# Patient Record
Sex: Male | Born: 1995 | Race: White | Hispanic: No | Marital: Single | Smoking: Never smoker
Health system: Southern US, Community
[De-identification: ages and names within clinical notes are randomized; demographics above are authoritative.]

---

## 2016-10-03 ENCOUNTER — Ambulatory Visit (HOSPITAL_COMMUNITY)
Admission: EM | Admit: 2016-10-03 | Discharge: 2016-10-03 | Disposition: A | Discharge status: Other Acute Inpatient Hospital | Payer: Self-pay

## 2016-10-03 ENCOUNTER — Emergency Department (HOSPITAL_COMMUNITY)
Admission: EM | Admit: 2016-10-03 | Discharge: 2016-10-03 | Disposition: A | Discharge status: Home / Self Care | Payer: BLUE CROSS/BLUE SHIELD | Attending: Emergency Medicine | Admitting: Emergency Medicine

## 2016-10-03 ENCOUNTER — Emergency Department (HOSPITAL_COMMUNITY): Payer: BLUE CROSS/BLUE SHIELD

## 2016-10-03 ENCOUNTER — Encounter (HOSPITAL_COMMUNITY): Payer: Self-pay | Admitting: *Deleted

## 2016-10-03 DIAGNOSIS — S61215A Laceration without foreign body of left ring finger without damage to nail, initial encounter: Secondary | ICD-10-CM | POA: Insufficient documentation

## 2016-10-03 DIAGNOSIS — Y929 Unspecified place or not applicable: Secondary | ICD-10-CM | POA: Insufficient documentation

## 2016-10-03 DIAGNOSIS — Y939 Activity, unspecified: Secondary | ICD-10-CM | POA: Diagnosis not present

## 2016-10-03 DIAGNOSIS — W260XXA Contact with knife, initial encounter: Secondary | ICD-10-CM | POA: Insufficient documentation

## 2016-10-03 DIAGNOSIS — Y999 Unspecified external cause status: Secondary | ICD-10-CM | POA: Diagnosis not present

## 2016-10-03 NOTE — ED Provider Notes (Signed)
[facility]-EMERGENCY DEPT Provider Note   CSN: [id number] Arrival date & time: 10/03/16  1540   By signing my name below, I, Teofilo Pod, attest that this documentation has been prepared under the direction and in the presence of Kerrie Buffalo, NP. Electronically Signed: Teofilo Pod, ED Scribe. 10/03/2016. 6:07 PM.   History   Chief Complaint Chief Complaint  Patient presents with  . Extremity Laceration    The history is provided by the patient. No language interpreter was used.  Laceration   The incident occurred 3 to 5 hours ago. The laceration is located on the left hand. The laceration mechanism was a a dirty knife. His tetanus status is UTD.   HPI Comments:  Justin Sawyer is a 21 y.o. male who presents to the Emergency Department complaining of a wound sustained to the left 4th and 5th finger that occurred 4 hours ago. Pt reports that he was cutting an onion and the knife slipped and cut his fingers. Pt complains of associated mild numbness. Tetanus UTD. Bleeding is controlled with pressure dressing. Pt denies other associated symptoms.   History reviewed. No pertinent past medical history.  There are no active problems to display for this patient.   History reviewed. No pertinent surgical history.     Home Medications    Prior to Admission medications   Not on File    Family History No family history on file.  Social History Social History  Substance Use Topics  . Smoking status: Never Smoker  . Smokeless tobacco: Never Used  . Alcohol use Not on file     Allergies   Patient has no known allergies.   Review of Systems Review of Systems  Constitutional: Negative for diaphoresis.  HENT: Negative.   Gastrointestinal: Negative for nausea and vomiting.  Musculoskeletal: Positive for arthralgias.  Skin: Positive for wound.  Neurological: Positive for numbness. Negative for light-headedness.  Psychiatric/Behavioral: The patient is not  nervous/anxious.      Physical Exam Updated Vital Signs BP (!) 126/95 (BP Location: Right Arm)   Pulse 88   Temp 97.7 F (36.5 C)   Resp 18   Ht  (1.956 m)   Wt 72.6 kg   SpO2 100%   BMI 18.97 kg/m   Physical Exam  Constitutional: He appears well-developed and well-nourished. No distress.  HENT:  Head: Normocephalic and atraumatic.  Eyes: Conjunctivae are normal.  Neck: Neck supple.  Cardiovascular: Normal rate.   Pulmonary/Chest: Effort normal.  Musculoskeletal: Normal range of motion.       Left hand: He exhibits tenderness and laceration. He exhibits normal range of motion and normal capillary refill. Normal sensation noted. Normal strength noted.       Hands: Avulsion laceration to the tip of the left index finger. No nail involvement. Normal range of motion and good strength.   Neurological: He is alert.  Skin: Skin is warm and dry.  Avulsion laceration to tip of left ring finger. Nail not involved. Bleeding controlled.  Psychiatric: He has a normal mood and affect. His behavior is normal.  Nursing note and vitals reviewed.    ED Treatments / Results: Wound cleaned with NSS, wound seal applied to the area and bleeding stopped. Non stick dressing applied and splint. Patient tolerated the procedure without any problems.    DIAGNOSTIC STUDIES:  Oxygen Saturation is 100% on RA, normal by my interpretation.    COORDINATION OF CARE:  6:05 PM Will order x-ray and repair laceration. Discussed treatment  plan with pt at bedside and pt agreed to plan.   Labs (all labs ordered are listed, but only abnormal results are displayed) Labs Reviewed - No data to display   Radiology Dg Finger Ring Left  Result Date: 10/03/2016 CLINICAL DATA:  Finger laceration with knife. Assess for foreign body. EXAM: LEFT RING FINGER 2+V COMPARISON:  None. FINDINGS: Soft tissue defect distal fourth finger without fracture deformity or dislocation. No destructive bony lesions. Overlying  bandage without subcutaneous gas or radiopaque foreign bodies. IMPRESSION: Distal fourth finger laceration without radiopaque foreign bodies or acute osseous process. Electronically Signed   By: Awilda Metro M.D.   On: 10/03/2016 18:51    Procedures Procedures (including critical care time)  Medications Ordered in ED Medications - No data to display   Initial Impression / Assessment and Plan / ED Course  I have reviewed the triage vital signs and the nursing notes.  Pertinent imaging results that were available during my care of the patient were reviewed by me and considered in my medical decision making (see chart for details).  Finger soaked in NSS. Wound explored and base of wound visualized without evidence of foreign body.  Laceration occurred < 8 hours prior to repair which was well tolerated.  Tdap up to date.  Pt has  no comorbidities to effect normal wound healing. Pt discharged without antibiotics.  Discussed home care with patient and answered questions. Pt to follow-up for wound check if any problems arise. Pt is hemodynamically stable with no complaints prior to dc.    Final Clinical Impressions(s) / ED Diagnoses   Final diagnoses:  Laceration of left ring finger without foreign body without damage to nail, initial encounter    New Prescriptions New Prescriptions   No medications on file  I personally performed the services described in this documentation, which was scribed in my presence. The recorded information has been reviewed and is accurate.     [id number] Maria Stein, NP 10/03/16 1928    Rolan Bucco, MD 10/03/16 [phone]

## 2016-10-03 NOTE — ED Triage Notes (Signed)
Pt states that he was cutting an onion about 1 hour ago. Pt states that he cut the tip of his left ring finger and a small laceration to the left 5th finger. Bleeding controlled at this time with gauze

## 2016-10-03 NOTE — Discharge Instructions (Signed)
We have closed your wound today with a protective wound dressing. If you develop redness around the area, red streaking, fever, chills or other problems return immediately. Wear the splint for comfort and keep the area dry.

## 2016-10-03 NOTE — Progress Notes (Signed)
Orthopedic Tech Progress Note Patient Details:  Justin Sawyer May 28, 1996 [id number]  Ortho Devices Type of Ortho Device: Finger splint Ortho Device/Splint Location: LUE ring finger Ortho Device/Splint Interventions: Ordered, Application   Jennye Moccasin 10/03/2016, 7:17 PM

## 2016-10-09 ENCOUNTER — Encounter: Payer: BLUE CROSS/BLUE SHIELD | Attending: Surgery | Admitting: Surgery

## 2016-10-09 DIAGNOSIS — W260XXA Contact with knife, initial encounter: Secondary | ICD-10-CM | POA: Insufficient documentation

## 2016-10-09 DIAGNOSIS — S61215A Laceration without foreign body of left ring finger without damage to nail, initial encounter: Secondary | ICD-10-CM | POA: Insufficient documentation

## 2016-10-10 NOTE — Progress Notes (Signed)
Justin Sawyer, Justin Sawyer ([id number]) Visit Report for 10/09/2016 Chief Complaint Document Details Patient Name: Justin Sawyer, Justin Sawyer Date of Service: 10/09/2016 8:00 AM Medical Record Number: [id number] Patient Account Number: [id number] Date of Birth/Sex: 1996-01-07 (20 y.o. Male) Treating RN: Ashok Cordia, Debi Primary Care Provider: PATIENT, NO Other Clinician: Referring Provider: BELFI, MELANIE Treating Provider/Extender: Rudene Re in Treatment: 0 Information Obtained from: Patient Chief Complaint Patient seen for complaints of Non-Healing Wound to the pulp of the left index finger which he sustained 6 days ago Electronic Signature(s) Signed: 10/09/2016 8:55:55 AM By: Evlyn Kanner MD, FACS Entered By: Evlyn Kanner on 10/09/2016 08:55:55 Justin Sawyer ([id number]) -------------------------------------------------------------------------------- HPI Details Patient Name: Justin Sawyer Date of Service: 10/09/2016 8:00 AM Medical Record Number: [id number] Patient Account Number: [id number] Date of Birth/Sex: [birth year] (20 y.o. Male) Treating RN: Ashok Cordia, Debi Primary Care Provider: PATIENT, NO Other Clinician: Referring Provider: BELFI, MELANIE Treating Provider/Extender: Rudene Re in Treatment: 0 History of Present Illness Location: tip of left index finger Quality: Patient reports experiencing a sharp pain to affected area(s). Severity: Patient states wound are getting worse. Duration: Patient has had the wound for < 2 weeks prior to presenting for treatment Timing: Pain in wound is constant (hurts all the time) Context: The wound occurred when the patient was cutting food stuff in the kitchen and sliced the tip of his finger Modifying Factors: Other treatment(s) tried include:went to the ER and had a workup done and was advised antibiotic cream Associated Signs and Symptoms: Patient reports having increase swelling. HPI Description: 21 year old with a laceration on the left  hand with a dirty knife on 10/03/2016. He was cut while using a cooking knife and bleeding was controlled with pressure.in the ED he was found to have a laceration to the tip of his left index finger with no involvement of the nail and normal range of motion and good strength. The wound was treated with a nonstick dressing and a splint. x-ray done showed no foreign bodies or acute osseous process. Electronic Signature(s) Signed: 10/09/2016 8:57:02 AM By: Evlyn Kanner MD, FACS Previous Signature: 10/09/2016 8:45:52 AM Version By: Evlyn Kanner MD, FACS Previous Signature: 10/09/2016 8:33:52 AM Version By: Evlyn Kanner MD, FACS Entered By: Evlyn Kanner on 10/09/2016 08:57:02 Justin Sawyer, Justin Sawyer ([id number]) -------------------------------------------------------------------------------- Physical Exam Details Patient Name: Justin Sawyer Date of Service: 10/09/2016 8:00 AM Medical Record Number: [id number] Patient Account Number: [id number] Date of Birth/Sex: 03-01-1996 (20 y.o. Male) Treating RN: Ashok Cordia, Debi Primary Care Provider: PATIENT, NO Other Clinician: Referring Provider: BELFI, MELANIE Treating Provider/Extender: Rudene Re in Treatment: 0 Constitutional . Pulse regular. Respirations normal and unlabored. Afebrile. . Eyes Nonicteric. Reactive to light. Ears, Nose, Mouth, and Throat Lips, teeth, and gums WNL.Marland Kitchen Moist mucosa without lesions. Neck supple and nontender. No palpable supraclavicular or cervical adenopathy. Normal sized without goiter. Respiratory WNL. No retractions.. Cardiovascular Pedal Pulses WNL. No clubbing, cyanosis or edema. Chest Breasts symmetical and no nipple discharge.. Breast tissue WNL, no masses, lumps, or tenderness.. Gastrointestinal (GI) Abdomen without masses or tenderness.. No liver or spleen enlargement or tenderness.. Lymphatic No adneopathy. No adenopathy. No adenopathy. Musculoskeletal Adexa without tenderness or enlargement.. Digits  and nails w/o clubbing, cyanosis, infection, petechiae, ischemia, or inflammatory conditions.. Integumentary (Hair, Skin) No suspicious lesions. No crepitus or fluctuance. No peri-wound warmth or erythema. No masses.Marland Kitchen Psychiatric Judgement and insight Intact.. No evidence of depression, anxiety, or agitation.. Notes the lateral part of the left ring finger pulp has been sliced off and there is local debris without any evidence of  cellulitis. The nail and nailbed is not involved. Electronic Signature(s) Signed: 10/09/2016 8:57:52 AM By: Evlyn Kanner MD, FACS Entered By: Evlyn Kanner on 10/09/2016 08:57:52 Justin Sawyer, Justin Sawyer ([id number]) -------------------------------------------------------------------------------- Physician Orders Details Patient Name: Justin Sawyer Date of Service: 10/09/2016 8:00 AM Medical Record Number: [id number] Patient Account Number: [id number] Date of Birth/Sex: Apr 18, 1996 (20 y.o. Male) Treating RN: Ashok Cordia, Debi Primary Care Provider: PATIENT, NO Other Clinician: Referring Provider: BELFI, MELANIE Treating Provider/Extender: Rudene Re in Treatment: 0 Verbal / Phone Orders: Yes Clinician: Pinkerton, Debi Read Back and Verified: Yes Diagnosis Coding Wound Cleansing Wound #1 Left Hand - 4th Digit o Clean wound with Normal Saline. o Cleanse wound with mild soap and water o May Shower, gently pat wound dry prior to applying new dressing. Anesthetic Wound #1 Left Hand - 4th Digit o Topical Lidocaine 4% cream applied to wound bed prior to debridement Primary Wound Dressing Wound #1 Left Hand - 4th Digit o Medihoney gel Secondary Dressing Wound #1 Left Hand - 4th Digit o Dry Gauze o Conform/Kerlix Dressing Change Frequency Wound #1 Left Hand - 4th Digit o Change dressing every day. Follow-up Appointments Wound #1 Left Hand - 4th Digit o Return Appointment in 1 week. Additional Orders / Instructions Wound #1 Left Hand - 4th  Digit o Increase protein intake. Electronic Signature(s) Signed: 10/09/2016 4:45:53 PM By: Evlyn Kanner MD, FACS Signed: 10/09/2016 4:47:36 PM By: Justin Sawyer, Justin Sawyer ([id number]) Entered By: Alejandro Mulling on 10/09/2016 08:39:11 Justin Sawyer, Justin Sawyer ([id number]) -------------------------------------------------------------------------------- Problem List Details Patient Name: Justin Sawyer Date of Service: 10/09/2016 8:00 AM Medical Record Number: [id number] Patient Account Number: [id number] Date of Birth/Sex: [birth year] (20 y.o. Male) Treating RN: Ashok Cordia, Debi Primary Care Provider: PATIENT, NO Other Clinician: Referring Provider: BELFI, MELANIE Treating Provider/Extender: Rudene Re in Treatment: 0 Active Problems ICD-10 Encounter Code Description Active Date Diagnosis S61.215A Laceration without foreign body of left ring finger without 10/09/2016 Yes damage to nail, initial encounter Inactive Problems Resolved Problems Electronic Signature(s) Signed: 10/09/2016 8:55:24 AM By: Evlyn Kanner MD, FACS Entered By: Evlyn Kanner on 10/09/2016 08:55:24 Justin Sawyer ([id number]) -------------------------------------------------------------------------------- Progress Note Details Patient Name: Justin Sawyer Date of Service: 10/09/2016 8:00 AM Medical Record Number: [id number] Patient Account Number: [id number] Date of Birth/Sex: 1995/09/29 (20 y.o. Male) Treating RN: Ashok Cordia, Debi Primary Care Provider: PATIENT, NO Other Clinician: Referring Provider: BELFI, MELANIE Treating Provider/Extender: Rudene Re in Treatment: 0 Subjective Chief Complaint Information obtained from Patient Patient seen for complaints of Non-Healing Wound to the pulp of the left index finger which he sustained 6 days ago History of Present Illness (HPI) The following HPI elements were documented for the patient's wound: Location: tip of left index finger Quality: Patient  reports experiencing a sharp pain to affected area(s). Severity: Patient states wound are getting worse. Duration: Patient has had the wound for < 2 weeks prior to presenting for treatment Timing: Pain in wound is constant (hurts all the time) Context: The wound occurred when the patient was cutting food stuff in the kitchen and sliced the tip of his finger Modifying Factors: Other treatment(s) tried include:went to the ER and had a workup done and was advised antibiotic cream Associated Signs and Symptoms: Patient reports having increase swelling. 21 year old with a laceration on the left hand with a dirty knife on 10/03/2016. He was cut while using a cooking knife and bleeding was controlled with pressure.in the ED he was found to have a laceration to the tip of his left index finger with no involvement of  the nail and normal range of motion and good strength. The wound was treated with a nonstick dressing and a splint. x-ray done showed no foreign bodies or acute osseous process. Wound History Patient presents with 1 open wound that has been present for approximately 6 days. Patient has been treating wound in the following manner: polysporin. Laboratory tests have not been performed in the last month. Patient reportedly has not tested positive for an antibiotic resistant organism. Patient reportedly has not tested positive for osteomyelitis. Patient reportedly has not had testing performed to evaluate circulation in the legs. Patient experiences the following problems associated with their wounds: swelling. Patient History Information obtained from Patient. Allergies NKDA Justin Sawyer, Justin Sawyer ([id number]) Social History Never smoker, Marital Status - Single, Alcohol Use - Rarely, Drug Use - No History, Caffeine Use - Daily. Review of Systems (ROS) Constitutional Symptoms (General Health) The patient has no complaints or symptoms. Eyes The patient has no complaints or  symptoms. Ear/Nose/Mouth/Throat The patient has no complaints or symptoms. Hematologic/Lymphatic The patient has no complaints or symptoms. Respiratory The patient has no complaints or symptoms. Cardiovascular The patient has no complaints or symptoms. Gastrointestinal The patient has no complaints or symptoms. Endocrine The patient has no complaints or symptoms. Genitourinary The patient has no complaints or symptoms. Immunological The patient has no complaints or symptoms. Integumentary (Skin) Complains or has symptoms of Wounds - cut. Musculoskeletal The patient has no complaints or symptoms. Neurologic The patient has no complaints or symptoms. Oncologic The patient has no complaints or symptoms. Psychiatric The patient has no complaints or symptoms. Medications: patient is not on any medications. Objective Constitutional Pulse regular. Respirations normal and unlabored. Afebrile. Justin Sawyer, Justin Sawyer ([id number]) Vitals Time Taken: 8:10 AM, Height: 77 in, Source: Stated, Weight: 160.5 lbs, Source: Measured, BMI: 19, Temperature: 97.5 F, Pulse: 66 bpm, Respiratory Rate: 18 breaths/min, Blood Pressure: 117/72 mmHg. Eyes Nonicteric. Reactive to light. Ears, Nose, Mouth, and Throat Lips, teeth, and gums WNL.Marland Kitchen Moist mucosa without lesions. Neck supple and nontender. No palpable supraclavicular or cervical adenopathy. Normal sized without goiter. Respiratory WNL. No retractions.. Cardiovascular Pedal Pulses WNL. No clubbing, cyanosis or edema. Chest Breasts symmetical and no nipple discharge.. Breast tissue WNL, no masses, lumps, or tenderness.. Gastrointestinal (GI) Abdomen without masses or tenderness.. No liver or spleen enlargement or tenderness.. Lymphatic No adneopathy. No adenopathy. No adenopathy. Musculoskeletal Adexa without tenderness or enlargement.. Digits and nails w/o clubbing, cyanosis, infection, petechiae, ischemia, or inflammatory  conditions.Marland Kitchen Psychiatric Judgement and insight Intact.. No evidence of depression, anxiety, or agitation.. General Notes: the lateral part of the left ring finger pulp has been sliced off and there is local debris without any evidence of cellulitis. The nail and nailbed is not involved. Integumentary (Hair, Skin) No suspicious lesions. No crepitus or fluctuance. No peri-wound warmth or erythema. No masses.. Wound #1 status is Open. Original cause of wound was Trauma. The wound is located on the Left Hand - 4th Digit. The wound measures 0.5cm length x 1cm width x 0.1cm depth; 0.393cm^2 area and 0.039cm^3 volume. There is no tunneling or undermining noted. There is a large amount of serosanguineous drainage noted. The wound margin is distinct with the outline attached to the wound base. There is large (67-100%) red granulation within the wound bed. There is a small (1-33%) amount of necrotic tissue within the wound bed including Eschar. Periwound temperature was noted as No Abnormality. The periwound has tenderness on palpation. Justin Sawyer, Justin Sawyer ([id number]) Assessment Active Problems ICD-10 [phone] - Laceration without foreign  body of left ring finger without damage to nail, initial encounter 21 year old gentleman who sustained a clean laceration of his left ring finger has been reviewed in the ER and no suturing was possible. After review I have recommended: 1. Washing with soap and water and applying Medihoney ointment locally and covering it with a gauze and appropriate dressing. 2. making sure the dressing is not overweight and to change it appropriately if inadvertently moist with water. 3. Regular visits to the wound center. Plan Wound Cleansing: Wound #1 Left Hand - 4th Digit: Clean wound with Normal Saline. Cleanse wound with mild soap and water May Shower, gently pat wound dry prior to applying new dressing. Anesthetic: Wound #1 Left Hand - 4th Digit: Topical Lidocaine 4% cream  applied to wound bed prior to debridement Primary Wound Dressing: Wound #1 Left Hand - 4th Digit: Medihoney gel Secondary Dressing: Wound #1 Left Hand - 4th Digit: Dry Gauze Conform/Kerlix Dressing Change Frequency: Wound #1 Left Hand - 4th Digit: Change dressing every day. Follow-up Appointments: Wound #1 Left Hand - 4th Digit: Return Appointment in 1 week. Additional Orders / Instructions: Wound #1 Left Hand - 4th Digit: Increase protein intake. TERESA, Justin Sawyer ([id number]) 21 year old gentleman who sustained a clean laceration of his left ring finger has been reviewed in the ER and no suturing was possible. After review I have recommended: 1. Washing with soap and water and applying Medihoney ointment locally and covering it with a gauze and appropriate dressing. 2. making sure the dressing is not overweight and to change it appropriately if inadvertently moist with water. 3. Regular visits to the wound center. Electronic Signature(s) Signed: 10/09/2016 9:00:53 AM By: Evlyn Kanner MD, FACS Entered By: Evlyn Kanner on 10/09/2016 09:00:53 Justin Sawyer, Justin Sawyer ([id number]) -------------------------------------------------------------------------------- ROS/PFSH Details Patient Name: Justin Sawyer Date of Service: 10/09/2016 8:00 AM Medical Record Number: [id number] Patient Account Number: [id number] Date of Birth/Sex: 1996/01/26 (20 y.o. Male) Treating RN: Ashok Cordia, Debi Primary Care Provider: PATIENT, NO Other Clinician: Referring Provider: BELFI, MELANIE Treating Provider/Extender: Rudene Re in Treatment: 0 Information Obtained From Patient Wound History Do you currently have one or more open woundso Yes How many open wounds do you currently haveo 1 Approximately how long have you had your woundso 6 days How have you been treating your wound(s) until nowo polysporin Has your wound(s) ever healed and then re-openedo No Have you had any lab work done in the past montho  No Have you tested positive for an antibiotic resistant organism (MRSA, VRE)o No Have you tested positive for osteomyelitis (bone infection)o No Have you had any tests for circulation on your legso No Have you had other problems associated with your woundso Swelling Integumentary (Skin) Complaints and Symptoms: Positive for: Wounds - cut Constitutional Symptoms (General Health) Complaints and Symptoms: No Complaints or Symptoms Eyes Complaints and Symptoms: No Complaints or Symptoms Ear/Nose/Mouth/Throat Complaints and Symptoms: No Complaints or Symptoms Hematologic/Lymphatic Complaints and Symptoms: No Complaints or Symptoms Respiratory Justin Sawyer, Justin Sawyer ([id number]) Complaints and Symptoms: No Complaints or Symptoms Cardiovascular Complaints and Symptoms: No Complaints or Symptoms Gastrointestinal Complaints and Symptoms: No Complaints or Symptoms Endocrine Complaints and Symptoms: No Complaints or Symptoms Genitourinary Complaints and Symptoms: No Complaints or Symptoms Immunological Complaints and Symptoms: No Complaints or Symptoms Musculoskeletal Complaints and Symptoms: No Complaints or Symptoms Neurologic Complaints and Symptoms: No Complaints or Symptoms Oncologic Complaints and Symptoms: No Complaints or Symptoms Psychiatric Complaints and Symptoms: No Complaints or Symptoms Immunizations Pneumococcal Vaccine: Received Pneumococcal Vaccination: No JOSS, MCDILL ([id number]) Family and  Social History Never smoker; Marital Status - Single; Alcohol Use: Rarely; Drug Use: No History; Caffeine Use: Daily; Financial Concerns: No; Food, Clothing or Shelter Needs: No; Support System Lacking: No; Transportation Concerns: No; Advanced Directives: No; Patient does not want information on Advanced Directives; Do not resuscitate: No; Living Will: No; Medical Power of Attorney: No Physician Affirmation I have reviewed and agree with the above  information. Electronic Signature(s) Signed: 10/09/2016 4:45:53 PM By: Evlyn Kanner MD, FACS Signed: 10/09/2016 4:47:36 PM By: Alejandro Mulling Entered By: Evlyn Kanner on 10/09/2016 08:18:29 MAICO, MULVEHILL ([id number]) -------------------------------------------------------------------------------- SuperBill Details Patient Name: Justin Sawyer Date of Service: 10/09/2016 Medical Record Number: [id number] Patient Account Number: [id number] Date of Birth/Sex: [birth year] (20 y.o. Male) Treating RN: Ashok Cordia, Debi Primary Care Provider: PATIENT, NO Other Clinician: Referring Provider: BELFI, MELANIE Treating Provider/Extender: Rudene Re in Treatment: 0 Diagnosis Coding ICD-10 Codes Code Description [phone] Laceration without foreign body of left ring finger without damage to nail, initial encounter Facility Procedures CPT4 Code: [id number] Description: 99214 - WOUND CARE VISIT-LEV 4 EST PT Modifier: Quantity: 1 Physician Procedures CPT4: Description Modifier Quantity Code [id number] - WC PHYS LEVEL 4 - NEW PT 1 ICD-10 Description Diagnosis S61.215A Laceration without foreign body of left ring finger without damage to nail, initial encounter Electronic Signature(s) Signed: 10/09/2016 4:45:53 PM By: Evlyn Kanner MD, FACS Signed: 10/09/2016 4:47:36 PM By: Alejandro Mulling Previous Signature: 10/09/2016 9:01:15 AM Version By: Evlyn Kanner MD, FACS Entered By: Alejandro Mulling on 10/09/2016 10:07:12

## 2016-10-10 NOTE — Progress Notes (Signed)
Justin Sawyer, Justin Sawyer ([id number]) Visit Report for 10/09/2016 Abuse/Suicide Risk Screen Details Patient Name: Justin Sawyer, Justin Sawyer Date of Service: 10/09/2016 8:00 AM Medical Record Number: [id number] Patient Account Number: [id number] Date of Birth/Sex: [birth year] (20 y.o. Male) Treating RN: Justin Sawyer Primary Care Justin Sawyer: PATIENT, NO Other Clinician: Referring Justin Sawyer: Sawyer, Justin Treating Dastan Krider/Extender: Justin Sawyer in Treatment: 0 Abuse/Suicide Risk Screen Items Answer ABUSE/SUICIDE RISK SCREEN: Has anyone close to you tried to hurt or harm you recentlyo No Do you feel uncomfortable with anyone in your familyo No Has anyone forced you do things that you didnot want to doo No Do you have any thoughts of harming yourselfo No Patient displays signs or symptoms of abuse and/or neglect. No Electronic Signature(s) Signed: 10/09/2016 4:47:36 PM By: Justin Sawyer Entered By: Justin Sawyer on 10/09/2016 08:14:10 Justin Sawyer ([id number]) -------------------------------------------------------------------------------- Activities of Daily Living Details Patient Name: Justin Sawyer Date of Service: 10/09/2016 8:00 AM Medical Record Number: [id number] Patient Account Number: [id number] Date of Birth/Sex: [birth year] (20 y.o. Male) Treating RN: Justin Sawyer Primary Care Justin Sawyer: PATIENT, NO Other Clinician: Referring Justin Sawyer: Sawyer, Justin Treating Justin Sawyer/Extender: Justin Sawyer in Treatment: 0 Activities of Daily Living Items Answer Activities of Daily Living (Please select one for each item) Drive Automobile Completely Able Take Medications Completely Able Use Telephone Completely Able Care for Appearance Completely Able Use Toilet Completely Able Bath / Shower Completely Able Dress Self Completely Able Feed Self Completely Able Walk Completely Able Get In / Out Bed Completely Able Housework Completely Able Prepare Meals Completely Able Handle Money  Completely Able Shop for Self Completely Able Electronic Signature(s) Signed: 10/09/2016 4:47:36 PM By: Justin Sawyer Entered By: Justin Sawyer Justin Sawyer ([id number]) -------------------------------------------------------------------------------- Education Assessment Details Patient Name: Justin Sawyer Date of Service: 10/09/2016 8:00 AM Medical Record Number: [id number] Patient Account Number: [id number] Date of Birth/Sex: [birth year] (20 y.o. Male) Treating RN: Justin Sawyer Primary Care Alani Lacivita: PATIENT, NO Other Clinician: Referring Bela Bonaparte: Sawyer, Justin Treating Justin Sawyer/Extender: Justin Sawyer in Treatment: 0 Primary Learner Assessed: Patient Learning Preferences/Education Level/Primary Language Learning Preference: Explanation, Printed Material Highest Education Level: College or Above Preferred Language: English Cognitive Barrier Assessment/Beliefs Language Barrier: No Translator Needed: No Memory Deficit: No Emotional Barrier: No Cultural/Religious Beliefs Affecting Medical No Care: Physical Barrier Assessment Impaired Vision: No Impaired Hearing: No Decreased Hand dexterity: No Knowledge/Comprehension Assessment Knowledge Level: Medium Comprehension Level: Medium Ability to understand written Medium instructions: Ability to understand verbal Medium instructions: Motivation Assessment Anxiety Level: Calm Cooperation: Cooperative Education Importance: Acknowledges Need Interest in Health Problems: Asks Questions Perception: Coherent Willingness to Engage in Self- Medium Management Activities: Readiness to Engage in Self- Medium Management Activities: Electronic Signature(s) Justin Sawyer ([id number]) Signed: 10/09/2016 4:47:36 PM By: Justin Sawyer Entered By: Justin Sawyer on 10/09/2016 08:14:41 Justin Sawyer ([id number]) -------------------------------------------------------------------------------- Fall  Risk Assessment Details Patient Name: Justin Sawyer Date of Service: 10/09/2016 8:00 AM Medical Record Number: [id number] Patient Account Number: [id number] Date of Birth/Sex: [birth year] (20 y.o. Male) Treating RN: Justin Sawyer Primary Care Justin Sawyer: PATIENT, NO Other Clinician: Referring Justin Sawyer: Sawyer, Justin Treating Marasia Newhall/Extender: Justin Sawyer in Treatment: 0 Fall Risk Assessment Items Have you had 2 or more falls in the last 12 monthso 0 No Have you had any fall that resulted in injury in the last 12 monthso 0 No FALL RISK ASSESSMENT: History of falling - immediate or within 3 months 0 No Secondary diagnosis 0 No Ambulatory aid None/bed rest/wheelchair/nurse 0 No Crutches/cane/walker 0 No Furniture 0 No IV Access/Saline Lock 0 No Gait/Training  Normal/bed rest/immobile 0 No Weak 0 No Impaired 0 No Mental Status Oriented to own ability 0 Yes Electronic Signature(s) Signed: 10/09/2016 4:47:36 PM By: Justin Sawyer Entered By: Justin Sawyer on 10/09/2016 08:14:47 Justin Sawyer ([id number]) -------------------------------------------------------------------------------- Foot Assessment Details Patient Name: Justin Sawyer Date of Service: 10/09/2016 8:00 AM Medical Record Number: [id number] Patient Account Number: [id number] Date of Birth/Sex: 24-Sep-1995 (20 y.o. Male) Treating RN: Justin Sawyer Primary Care Justin Sawyer: PATIENT, NO Other Clinician: Referring Justin Sawyer: Sawyer, Justin Treating Justin Sawyer/Extender: Justin Sawyer in Treatment: 0 Foot Assessment Items Site Locations + = Sensation present, - = Sensation absent, C = Callus, U = Ulcer R = Redness, W = Warmth, M = Maceration, PU = Pre-ulcerative lesion F = Fissure, S = Swelling, D = Dryness Assessment Right: Left: Other Deformity: No No Prior Foot Ulcer: No No Prior Amputation: No No Charcot Joint: No No Ambulatory Status: Gait: Electronic Signature(s) Signed: 10/09/2016 4:47:36 PM By:  Justin Sawyer Entered By: Justin Sawyer on 10/09/2016 08:15:05 Justin Sawyer ([id number]) -------------------------------------------------------------------------------- Nutrition Risk Assessment Details Patient Name: Justin Sawyer Date of Service: 10/09/2016 8:00 AM Medical Record Number: [id number] Patient Account Number: [id number] Date of Birth/Sex: 09/20/1995 (20 y.o. Male) Treating RN: Justin Sawyer Primary Care Jimel Myler: PATIENT, NO Other Clinician: Referring Roshni Burbano: Sawyer, Justin Treating Kit Mollett/Extender: Justin Sawyer in Treatment: 0 Height (in): 77 Weight (lbs): 160.5 Body Mass Index (BMI): 19 Nutrition Risk Assessment Items NUTRITION RISK SCREEN: I have an illness or condition that made me change the kind and/or 0 No amount of food I eat I eat fewer than two meals per day 0 No I eat few fruits and vegetables, or milk products 0 No I have three or more drinks of beer, liquor or wine almost every day 0 No I have tooth or mouth problems that make it hard for me to eat 0 No I don't always have enough money to buy the food I need 0 No I eat alone most of the time 0 No I take three or more different prescribed or over-the-counter drugs a 0 No day Without wanting to, I have lost or gained 10 pounds in the last six 0 No months I am not always physically able to shop, cook and/or feed myself 0 No Nutrition Protocols Good Risk Protocol Moderate Risk Protocol Electronic Signature(s) Signed: 10/09/2016 4:47:36 PM By: Justin Sawyer Entered By: Justin Sawyer on 10/09/2016 08:14:57

## 2016-10-10 NOTE — Progress Notes (Addendum)
Justin Sawyer ([id number]) Visit Report for 10/09/2016 Allergy List Details Patient Name: Justin Sawyer, Justin Sawyer Date of Service: 10/09/2016 8:00 AM Medical Record Number: [id number] Patient Account Number: [id number] Date of Birth/Sex: 1995/06/26 (21 y.o. Male) Treating RN: Phillis Haggis Primary Care Abbeygail Igoe: PATIENT, NO Other Clinician: Referring Samy Ryner: BELFI, MELANIE Treating Nedda Gains/Extender: Rudene Re in Treatment: 0 Allergies Active Allergies NKDA Allergy Notes Electronic Signature(s) Signed: 10/09/2016 4:47:36 PM By: Alejandro Mulling Entered By: Alejandro Mulling on 10/09/2016 08:11:54 Justin Sawyer ([id number]) -------------------------------------------------------------------------------- Arrival Information Details Patient Name: Justin Sawyer Date of Service: 10/09/2016 8:00 AM Medical Record Number: [id number] Patient Account Number: [id number] Date of Birth/Sex: 1996/06/03 (21 y.o. Male) Treating RN: Ashok Cordia, Debi Primary Care Kendyll Huettner: PATIENT, NO Other Clinician: Referring Jeana Kersting: BELFI, MELANIE Treating Lenon Kuennen/Extender: Rudene Re in Treatment: 0 Visit Information Patient Arrived: Ambulatory Arrival Time: 08:08 Accompanied By: self Transfer Assistance: EasyPivot Patient Lift Patient Identification Verified: No Secondary Verification Process No Completed: Patient Requires Transmission- No Based Precautions: Patient Has Alerts: No Electronic Signature(s) Signed: 10/09/2016 4:47:36 PM By: Alejandro Mulling Entered By: Alejandro Mulling on 10/09/2016 08:09:18 Justin Sawyer ([id number]) -------------------------------------------------------------------------------- Clinic Level of Care Assessment Details Patient Name: Justin Sawyer Date of Service: 10/09/2016 8:00 AM Medical Record Number: [id number] Patient Account Number: [id number] Date of Birth/Sex: 1995-07-11 (21 y.o. Male) Treating RN: Ashok Cordia, Debi Primary Care Xavien Dauphinais: PATIENT,  NO Other Clinician: Referring Chrisoula Zegarra: BELFI, MELANIE Treating Porsche Noguchi/Extender: Rudene Re in Treatment: 0 Clinic Level of Care Assessment Items TOOL 2 Quantity Score X - Use when only an EandM is performed on the INITIAL visit 1 0 ASSESSMENTS - Nursing Assessment / Reassessment X - General Physical Exam (combine w/ comprehensive assessment (listed just 1 20 below) when performed on new pt. evals) X - Comprehensive Assessment (HX, ROS, Risk Assessments, Wounds Hx, etc.) 1 25 ASSESSMENTS - Wound and Skin Assessment / Reassessment X - Simple Wound Assessment / Reassessment - one wound 1 5  - Complex Wound Assessment / Reassessment - multiple wounds 0  - Dermatologic / Skin Assessment (not related to wound area) 0 ASSESSMENTS - Ostomy and/or Continence Assessment and Care  - Incontinence Assessment and Management 0  - Ostomy Care Assessment and Management (repouching, etc.) 0 PROCESS - Coordination of Care  - Simple Patient / Family Education for ongoing care 0 X - Complex (extensive) Patient / Family Education for ongoing care 1 20 X - Staff obtains Chiropractor, Records, Test Results / Process Orders 1 10  - Staff telephones HHA, Nursing Homes / Clarify orders / etc 0  - Routine Transfer to another Facility (non-emergent condition) 0  - Routine Hospital Admission (non-emergent condition) 0 X - New Admissions / Manufacturing engineer / Ordering NPWT, Apligraf, etc. 1 15  - Emergency Hospital Admission (emergent condition) 0 X - Simple Discharge Coordination 1 10 Leja, Justin ([id number])  - Complex (extensive) Discharge Coordination 0 PROCESS - Special Needs  - Pediatric / Minor Patient Management 0  - Isolation Patient Management 0  - Hearing / Language / Visual special needs 0  - Assessment of Community assistance (transportation, D/C planning, etc.) 0  - Additional assistance / Altered mentation 0  - Support Surface(s) Assessment (bed,  cushion, seat, etc.) 0 INTERVENTIONS - Wound Cleansing / Measurement X - Wound Imaging (photographs - any number of wounds) 1 5  - Wound Tracing (instead of photographs) 0 X - Simple Wound Measurement - one wound 1 5  - Complex Wound Measurement - multiple wounds 0 X - Simple Wound Cleansing - one wound  1 5  - Complex Wound Cleansing - multiple wounds 0 INTERVENTIONS - Wound Dressings X - Small Wound Dressing one or multiple wounds 1 10  - Medium Wound Dressing one or multiple wounds 0  - Large Wound Dressing one or multiple wounds 0  - Application of Medications - injection 0 INTERVENTIONS - Miscellaneous  - External ear exam 0  - Specimen Collection (cultures, biopsies, blood, body fluids, etc.) 0  - Specimen(s) / Culture(s) sent or taken to Lab for analysis 0  - Patient Transfer (multiple staff / Michiel Sites Lift / Similar devices) 0  - Simple Staple / Suture removal (25 or less) 0  - Complex Staple / Suture removal (26 or more) 0 Justin Sawyer, Justin ([id number])  - Hypo / Hyperglycemic Management (close monitor of Blood Glucose) 0  - Ankle / Brachial Index (ABI) - do not check if billed separately 0 Has the patient been seen at the hospital within the last three years: Yes Total Score: 130 Level Of Care: New/Established - Level 4 Electronic Signature(s) Signed: 10/09/2016 4:47:36 PM By: Alejandro Mulling Entered By: Alejandro Mulling on 10/09/2016 10:07:02 Justin Sawyer ([id number]) -------------------------------------------------------------------------------- Encounter Discharge Information Details Patient Name: Justin Sawyer Date of Service: 10/09/2016 8:00 AM Medical Record Number: [id number] Patient Account Number: [id number] Date of Birth/Sex: 1995/10/09 (21 y.o. Male) Treating RN: Ashok Cordia, Debi Primary Care Kenyatte Gruber: PATIENT, NO Other Clinician: Referring Brenn Deziel: BELFI, MELANIE Treating Cletis Clack/Extender: Rudene Re in Treatment: 0 Encounter  Discharge Information Items Discharge Pain Level: 0 Discharge Condition: Stable Ambulatory Status: Ambulatory Discharge Destination: Home Transportation: Private Auto Accompanied By: self Schedule Follow-up Appointment: Yes Medication Reconciliation completed and provided to Patient/Care No Genee Rann: Provided on Clinical Summary of Care: 10/09/2016 Form Type Recipient Paper Patient JM Electronic Signature(s) Signed: 10/09/2016 8:47:21 AM By: Gwenlyn Perking Entered By: Gwenlyn Perking on 10/09/2016 08:47:21 Justin Sawyer ([id number]) -------------------------------------------------------------------------------- Lower Extremity Assessment Details Patient Name: Justin Sawyer Date of Service: 10/09/2016 8:00 AM Medical Record Number: [id number] Patient Account Number: [id number] Date of Birth/Sex: September 26, 1995 (21 y.o. Male) Treating RN: Phillis Haggis Primary Care Imani Fiebelkorn: PATIENT, NO Other Clinician: Referring Perrion Diesel: BELFI, MELANIE Treating Kyri Dai/Extender: Rudene Re in Treatment: 0 Electronic Signature(s) Signed: 10/09/2016 4:47:36 PM By: Alejandro Mulling Entered By: Alejandro Mulling on 10/09/2016 08:15:15 Justin Sawyer ([id number]) -------------------------------------------------------------------------------- Multi Wound Chart Details Patient Name: Justin Sawyer Date of Service: 10/09/2016 8:00 AM Medical Record Number: [id number] Patient Account Number: [id number] Date of Birth/Sex: [birth year] (21 y.o. Male) Treating RN: Ashok Cordia, Debi Primary Care Jannell Franta: PATIENT, NO Other Clinician: Referring Duell Holdren: BELFI, MELANIE Treating Jathan Balling/Extender: Rudene Re in Treatment: 0 Vital Signs Height(in): 77 Pulse(bpm): 66 Weight(lbs): 160.5 Blood Pressure 117/72 (mmHg): Body Mass Index(BMI): 19 Temperature(F): 97.5 Respiratory Rate 18 (breaths/min): Photos: [1:No Photos] [N/A:N/A] Wound Location: [1:Left Hand - 4th Digit] [N/A:N/A] Wounding  Event: [1:Trauma] [N/A:N/A] Primary Etiology: [1:Trauma, Other] [N/A:N/A] Date Acquired: [1:10/03/2016] [N/A:N/A] Weeks of Treatment: [1:0] [N/A:N/A] Wound Status: [1:Open] [N/A:N/A] Measurements L x W x D 0.5x1x0.1 [N/A:N/A] (cm) Area (cm) : [1:0.393] [N/A:N/A] Volume (cm) : [1:0.039] [N/A:N/A] Classification: [1:Partial Thickness] [N/A:N/A] Exudate Amount: [1:Large] [N/A:N/A] Exudate Type: [1:Serosanguineous] [N/A:N/A] Exudate Color: [1:red, brown] [N/A:N/A] Wound Margin: [1:Distinct, outline attached] [N/A:N/A] Granulation Amount: [1:Large (67-100%)] [N/A:N/A] Granulation Quality: [1:Red] [N/A:N/A] Necrotic Amount: [1:Small (1-33%)] [N/A:N/A] Necrotic Tissue: [1:Eschar] [N/A:N/A] Epithelialization: [1:None] [N/A:N/A] Periwound Skin Texture: No Abnormalities Noted [N/A:N/A] Periwound Skin [1:No Abnormalities Noted] [N/A:N/A] Moisture: Periwound Skin Color: No Abnormalities Noted [N/A:N/A] Temperature: [1:No Abnormality] [N/A:N/A] Tenderness on [1:Yes] [N/A:N/A] Palpation: Wound Preparation: [N/A:N/A] Ulcer Cleansing: Rinsed/Irrigated with Saline  Topical Anesthetic Applied: Other: lidocaine 4% Treatment Notes Wound #1 (Left Hand - 4th Digit) 1. Cleansed with: Clean wound with Normal Saline 2. Anesthetic Topical Lidocaine 4% cream to wound bed prior to debridement 4. Dressing Applied: Medihoney Gel 5. Secondary Dressing Applied Dry Gauze Kerlix/Conform 7. Secured with Secretary/administrator) Signed: 10/09/2016 8:55:29 AM By: Evlyn Kanner MD, FACS Entered By: Evlyn Kanner on 10/09/2016 08:55:29 Justin Sawyer, Justin ([id number]) -------------------------------------------------------------------------------- Multi-Disciplinary Care Plan Details Patient Name: Justin Sawyer Date of Service: 10/09/2016 8:00 AM Medical Record Number: [id number] Patient Account Number: [id number] Date of Birth/Sex: 05-09-1996 (21 y.o. Male) Treating RN: Phillis Haggis Primary Care  Avantae Bither: PATIENT, NO Other Clinician: Referring Kameron Glazebrook: BELFI, MELANIE Treating Micaylah Bertucci/Extender: Rudene Re in Treatment: 0 Active Inactive Electronic Signature(s) Signed: 11/10/2016 11:38:14 AM By: Elliot Gurney, BSN, RN, CWS, Kim RN, BSN Signed: 11/17/2016 11:32:34 AM By: Alejandro Mulling Previous Signature: 10/09/2016 4:47:36 PM Version By: Alejandro Mulling Entered By: Elliot Gurney BSN, RN, CWS, Kim on 11/10/2016 11:38:14 Justin Sawyer, Justin ([id number]) -------------------------------------------------------------------------------- Pain Assessment Details Patient Name: Justin Sawyer Date of Service: 10/09/2016 8:00 AM Medical Record Number: [id number] Patient Account Number: [id number] Date of Birth/Sex: September 04, 1995 (21 y.o. Male) Treating RN: Ashok Cordia, Debi Primary Care Najah Liverman: PATIENT, NO Other Clinician: Referring Kelina Beauchamp: BELFI, MELANIE Treating Ravneet Spilker/Extender: Rudene Re in Treatment: 0 Active Problems Location of Pain Severity and Description of Pain Patient Has Paino Yes Site Locations Pain Location: Pain in Ulcers With Dressing Change: Yes Duration of the Pain. Constant / Intermittento Constant Rate the pain. Current Pain Level: 2 Worst Pain Level: 4 Least Pain Level: 2 Character of Pain Describe the Pain: Tender, Throbbing Pain Management and Medication Current Pain Management: Electronic Signature(s) Signed: 10/09/2016 4:47:36 PM By: Alejandro Mulling Entered By: Alejandro Mulling on 10/09/2016 08:10:43 Justin Sawyer ([id number]) -------------------------------------------------------------------------------- Patient/Caregiver Education Details Patient Name: Justin Sawyer Date of Service: 10/09/2016 8:00 AM Medical Record Number: [id number] Patient Account Number: [id number] Date of Birth/Gender: 1996/03/10 (21 y.o. Male) Treating RN: Ashok Cordia, Debi Primary Care Physician: PATIENT, NO Other Clinician: Referring Physician: BELFI, MELANIE Treating  Physician/Extender: Rudene Re in Treatment: 0 Education Assessment Education Provided To: Patient Education Topics Provided [facility]: Handouts: [facility] Methods: Explain/Verbal Responses: State content correctly Wound/Skin Impairment: Handouts: Other: change dressing as ordered Methods: Demonstration, Explain/Verbal Responses: State content correctly Electronic Signature(s) Signed: 10/09/2016 4:47:36 PM By: Alejandro Mulling Entered By: Alejandro Mulling on 10/09/2016 08:23:27 Justin Sawyer ([id number]) -------------------------------------------------------------------------------- Wound Assessment Details Patient Name: Justin Sawyer Date of Service: 10/09/2016 8:00 AM Medical Record Number: [id number] Patient Account Number: [id number] Date of Birth/Sex: 1995-08-13 (21 y.o. Male) Treating RN: Ashok Cordia, Debi Primary Care Charity Tessier: PATIENT, NO Other Clinician: Referring Aviv Lengacher: BELFI, MELANIE Treating Caidon Foti/Extender: Rudene Re in Treatment: 0 Wound Status Wound Number: 1 Primary Etiology: Trauma, Other Wound Location: Left Hand - 4th Digit Wound Status: Open Wounding Event: Trauma Date Acquired: 10/03/2016 Weeks Of Treatment: 0 Clustered Wound: No Photos Photo Uploaded By: Alejandro Mulling on 10/09/2016 14:20:46 Wound Measurements Length: (cm) 0.5 Width: (cm) 1 Depth: (cm) 0.1 Area: (cm) 0.393 Volume: (cm) 0.039 % Reduction in Area: % Reduction in Volume: Epithelialization: None Tunneling: No Undermining: No Wound Description Classification: Partial Thickness Foul Odor Aft Wound Margin: Distinct, outline attached Slough/Fibrin Exudate Amount: Large Exudate Type: Serosanguineous Exudate Color: red, brown er Cleansing: No o No Wound Bed Granulation Amount: Large (67-100%) Granulation Quality: Red Necrotic Amount: Small (1-33%) Necrotic Quality: Justin Sawyer, Justin  ([id number]) Periwound Skin Texture Texture Color No Abnormalities  Noted: No No Abnormalities Noted: No Moisture Temperature / Pain No Abnormalities Noted: No Temperature: No Abnormality Tenderness on Palpation: Yes Wound Preparation Ulcer Cleansing: Rinsed/Irrigated with Saline Topical Anesthetic Applied: Other: lidocaine 4%, Electronic Signature(s) Signed: 10/09/2016 4:47:36 PM By: Alejandro Mulling Entered By: Alejandro Mulling on 10/09/2016 08:18:45 Justin Sawyer ([id number]) -------------------------------------------------------------------------------- Vitals Details Patient Name: Justin Sawyer Date of Service: 10/09/2016 8:00 AM Medical Record Number: [id number] Patient Account Number: [id number] Date of Birth/Sex: Jul 21, 1995 (21 y.o. Male) Treating RN: Ashok Cordia, Debi Primary Care Collen Vincent: PATIENT, NO Other Clinician: Referring Sianna Garofano: BELFI, MELANIE Treating Laquentin Loudermilk/Extender: Rudene Re in Treatment: 0 Vital Signs Time Taken: 08:10 Temperature (F): 97.5 Height (in): 77 Pulse (bpm): 66 Source: Stated Respiratory Rate (breaths/min): 18 Weight (lbs): 160.5 Blood Pressure (mmHg): 117/72 Source: Measured Reference Range: 80 - 120 mg / dl Body Mass Index (BMI): 19 Electronic Signature(s) Signed: 10/09/2016 4:47:36 PM By: Alejandro Mulling Entered By: Alejandro Mulling on 10/09/2016 08:11:17

## 2016-10-16 ENCOUNTER — Ambulatory Visit: Payer: BLUE CROSS/BLUE SHIELD | Admitting: Surgery

## 2017-11-15 IMAGING — CR DG FINGER RING 2+V*L*
3 series · 3 of 3 positions shown · non-contrast
Comparison: None.

CLINICAL DATA: Finger laceration with knife. Assess for foreign
body.

EXAM:
LEFT RING FINGER 2+V

[finger ap]
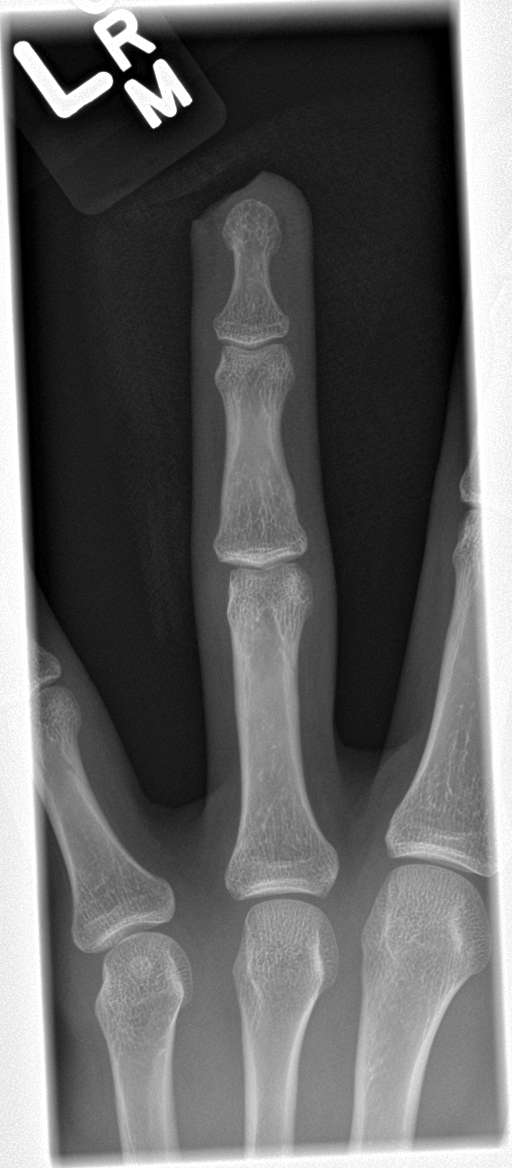

[finger obl]
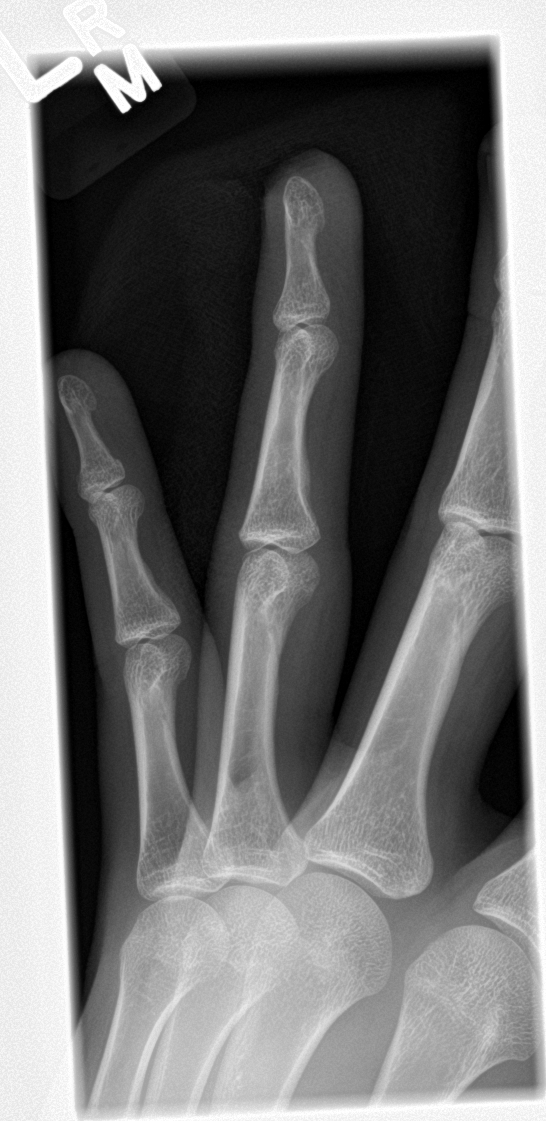

[finger lat]
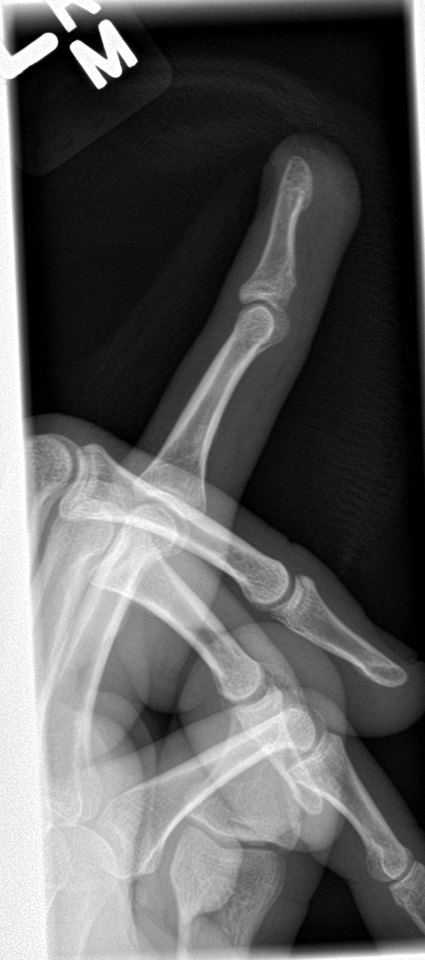

[3 of 3 positions shown; findings below may reference images not displayed]

FINDINGS: Soft tissue defect distal fourth finger without fracture deformity
or dislocation.. No destructive bony lesions. Overlying bandage
without subcutaneous gas or radiopaque foreign bodies.
IMPRESSION: Distal fourth finger laceration without radiopaque foreign bodies or
acute osseous process.
